# Patient Record
Sex: Male | Born: 1990 | Race: White | Hispanic: No | Marital: Single
Health system: Southern US, Community
[De-identification: ages and names within clinical notes are randomized; demographics above are authoritative.]

---

## 2012-04-07 ENCOUNTER — Emergency Department: Payer: Self-pay | Admitting: Emergency Medicine

## 2012-04-07 LAB — COMPREHENSIVE METABOLIC PANEL
Bilirubin,Total: 0.6 mg/dL (ref 0.2–1.0)
Chloride: 105 mmol/L (ref 98–107)
Co2: 27 mmol/L (ref 21–32)
Creatinine: 0.92 mg/dL (ref 0.60–1.30)
EGFR (Non-African Amer.): >60
Osmolality: 281 (ref 275–301)
Potassium: 3.7 mmol/L (ref 3.5–5.1)
SGOT(AST): 26 U/L (ref 10–41)
SGPT (ALT): 39 U/L (ref 12–78)

## 2012-04-07 LAB — CBC
HCT: 42.1 % (ref 40.0–52.0)
MCHC: 35.6 g/dL (ref 32.0–36.0)
MCV: 93 fL (ref 80–100)
Platelet: 311 10*3/uL (ref 150–440)
RBC: 4.55 10*6/uL (ref 4.40–5.90)
RDW: 12 % (ref 11.5–14.5)
WBC: 5.4 10*3/uL (ref 3.8–10.6)

## 2012-04-07 LAB — URINALYSIS, COMPLETE
Bilirubin,UR: NEGATIVE
Blood: NEGATIVE
Ketone: NEGATIVE
Nitrite: NEGATIVE
Ph: 5 (ref 4.5–8.0)
Protein: NEGATIVE
Specific Gravity: 1.026 (ref 1.003–1.030)
Squamous Epithelial: NONE SEEN
WBC UR: 1 /HPF (ref 0–5)

## 2012-04-07 LAB — DRUG SCREEN, URINE
Amphetamines, Ur Screen: POSITIVE (ref ?–1000)
Cannabinoid 50 Ng, Ur ~~LOC~~: NEGATIVE (ref ?–50)
MDMA (Ecstasy)Ur Screen: NEGATIVE (ref ?–500)
Methadone, Ur Screen: NEGATIVE (ref ?–300)
Opiate, Ur Screen: NEGATIVE (ref ?–300)
Phencyclidine (PCP) Ur S: NEGATIVE (ref ?–25)

## 2013-02-11 ENCOUNTER — Emergency Department: Payer: Self-pay | Admitting: Emergency Medicine

## 2013-02-22 ENCOUNTER — Emergency Department: Payer: Self-pay

## 2014-04-26 IMAGING — CT CT HEAD WITHOUT CONTRAST
2 series · 16 of 30 positions shown, 20 images · non-contrast
Comparison: none

REASON FOR EXAM: fell out of bed, ??seizure??, no history
COMMENTS:

PROCEDURE:     CT  - CT HEAD WITHOUT CONTRAST  - April 08, 2012 [DATE]
RESULT:     History: Fall.
Comparison Study: No recent.

[Series 2: without · axial · non-contrast · 0.44mm/px · z∈[-57,+73]mm · 13 of 36 slices shown, 17 images]
[im 3/36  brain]
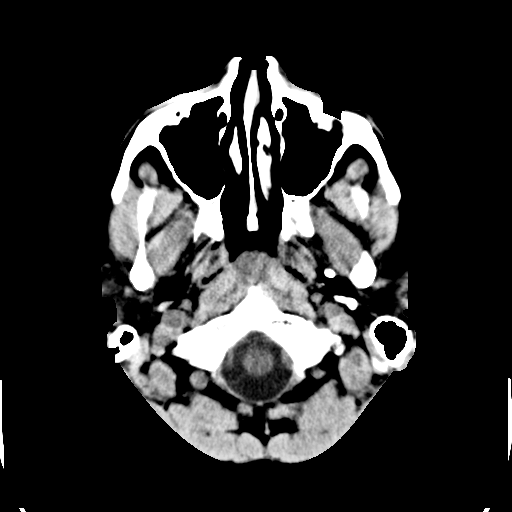
[im 3/36  bone]
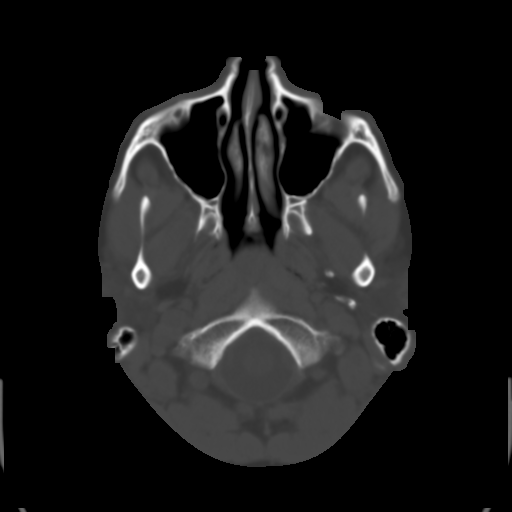
[im 6/36  brain]
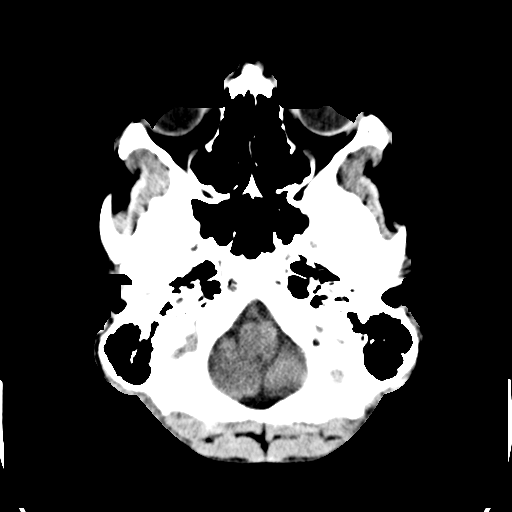
[im 8/36  brain]
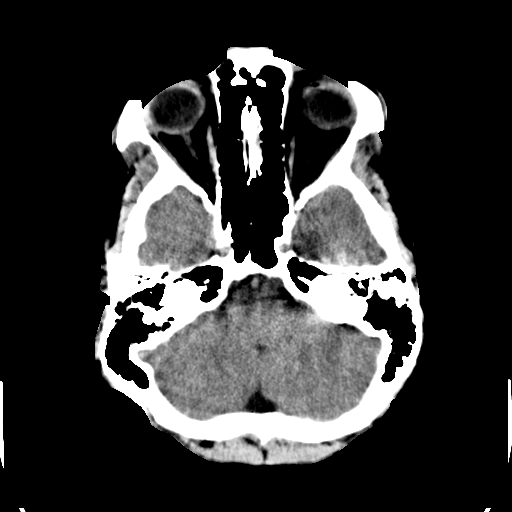
[im 11/36  brain]
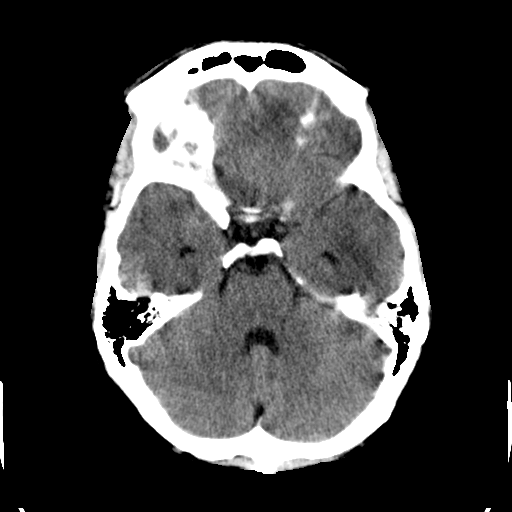
[im 13/36  brain]
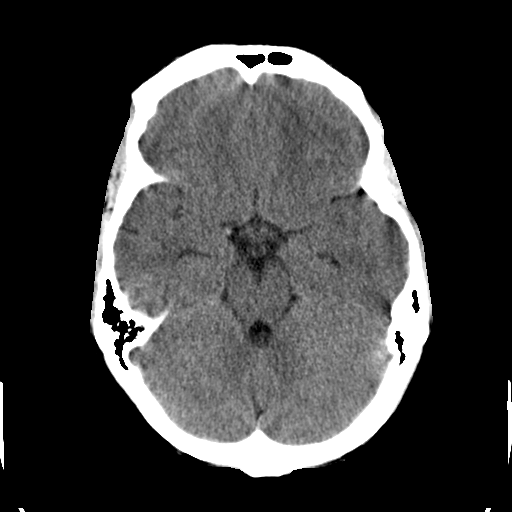
[im 13/36  bone]
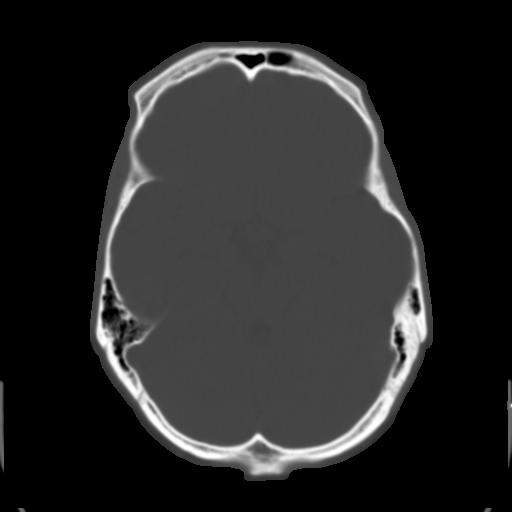
[im 16/36  brain]
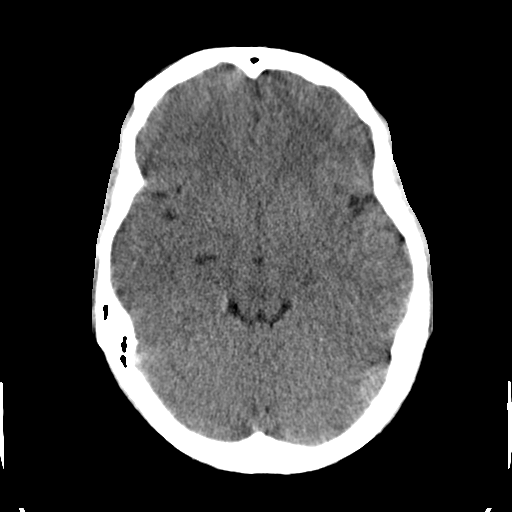
[im 18/36  brain]
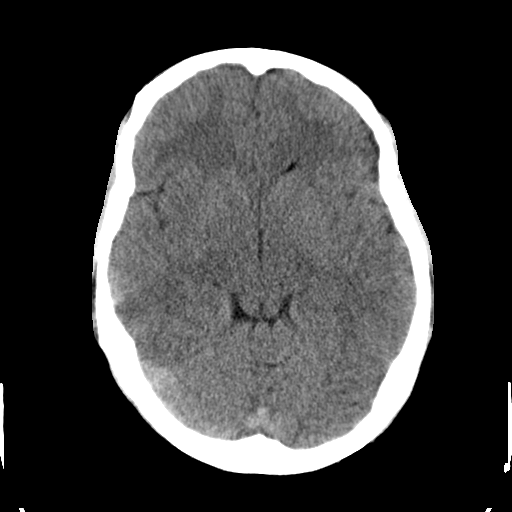
[im 21/36  brain]
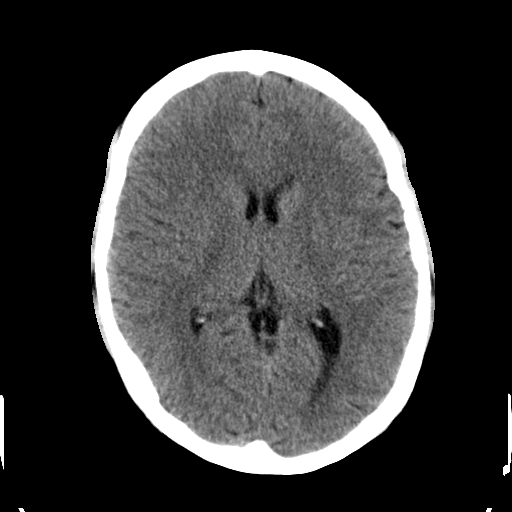
[im 23/36  brain]
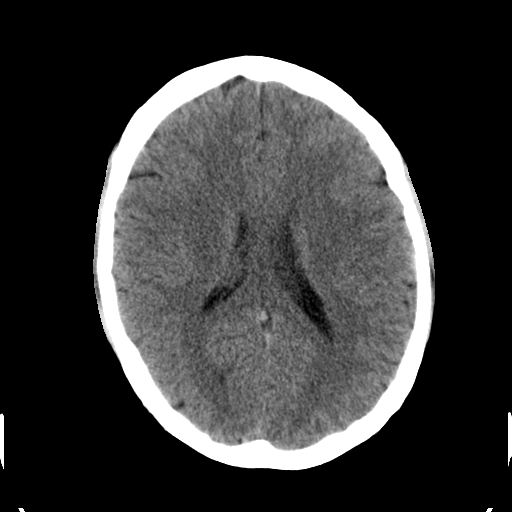
[im 23/36  bone]
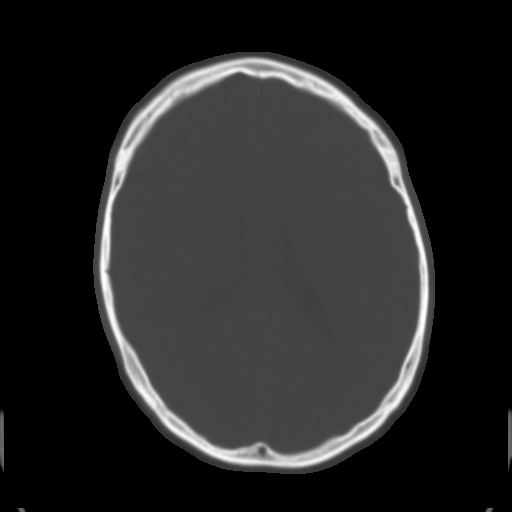
[im 26/36  brain]
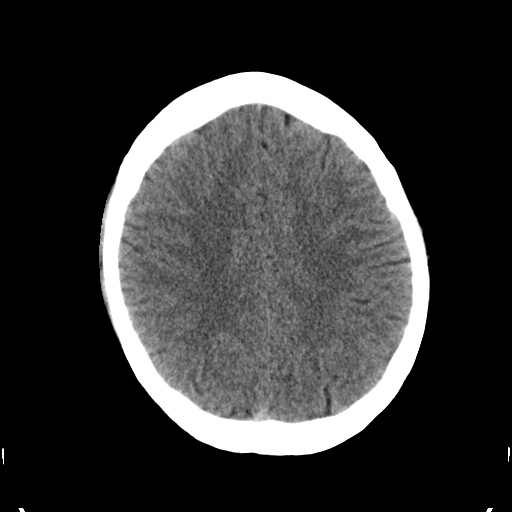
[im 28/36  brain]
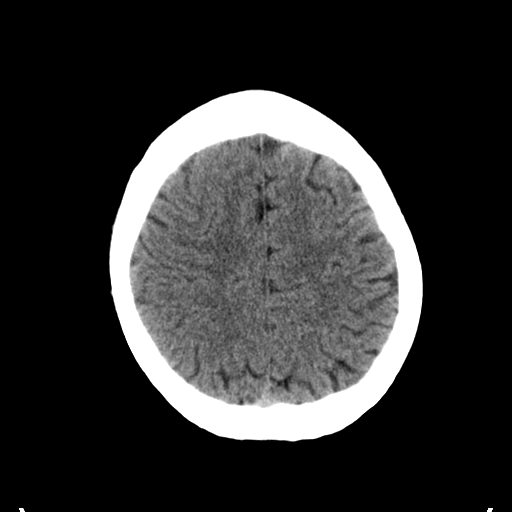
[im 31/36  brain]
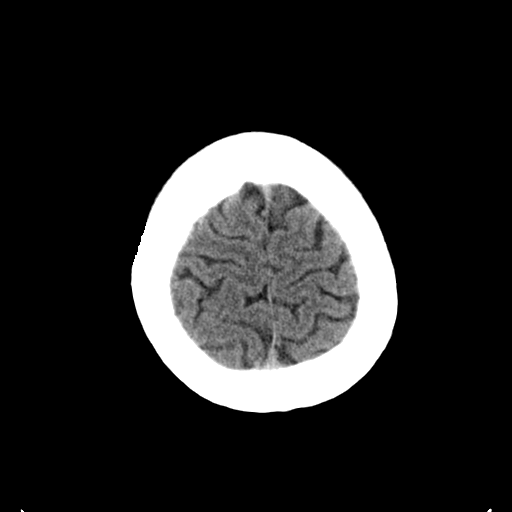
[im 33/36  brain]
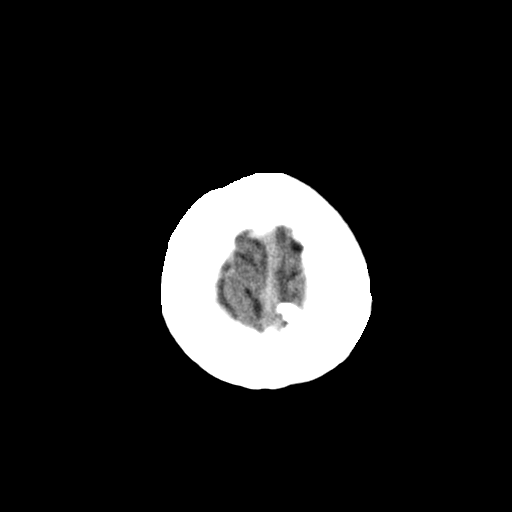
[im 33/36  bone]
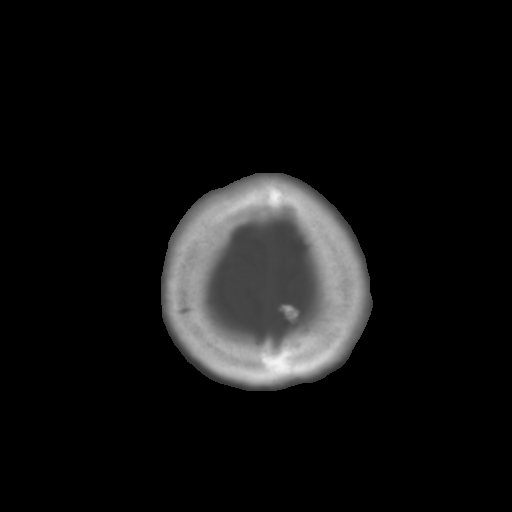

[Series 3: bone · axial · 0.44mm/px · z∈[-57,-14]mm · 3 of 36 slices shown]
[im 3/36  bone]
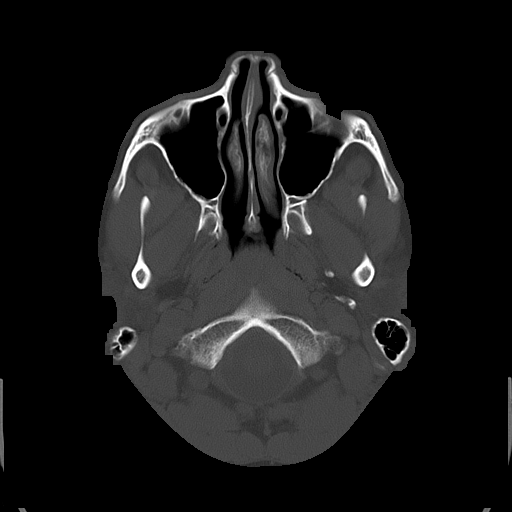
[im 8/36  bone]
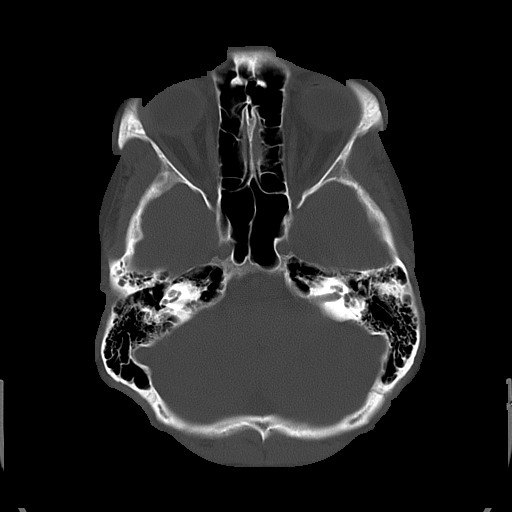
[im 13/36  bone]
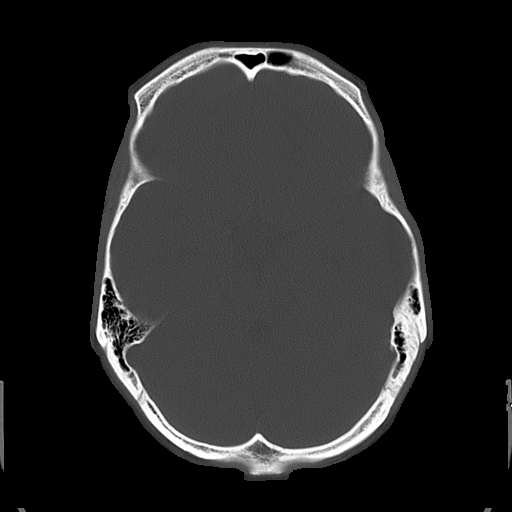

[16 of 30 positions shown; findings below may reference images not displayed]

FINDINGS: Standard nonenhanced CT obtained. No mass. No hydrocephalus. No
hemorrhage. No acute bony abnormality.
IMPRESSION: No acute abnormality.

## 2015-07-21 ENCOUNTER — Emergency Department
Admission: EM | Admit: 2015-07-21 | Discharge: 2015-07-21 | Disposition: A | Discharge status: Home / Self Care | Payer: 59 | Attending: Emergency Medicine | Admitting: Emergency Medicine

## 2015-07-21 ENCOUNTER — Emergency Department: Payer: 59

## 2015-07-21 DIAGNOSIS — S61411A Laceration without foreign body of right hand, initial encounter: Secondary | ICD-10-CM | POA: Insufficient documentation

## 2015-07-21 DIAGNOSIS — Y9389 Activity, other specified: Secondary | ICD-10-CM | POA: Insufficient documentation

## 2015-07-21 DIAGNOSIS — Z23 Encounter for immunization: Secondary | ICD-10-CM | POA: Diagnosis not present

## 2015-07-21 DIAGNOSIS — S61431A Puncture wound without foreign body of right hand, initial encounter: Secondary | ICD-10-CM | POA: Diagnosis not present

## 2015-07-21 DIAGNOSIS — W268XXA Contact with other sharp object(s), not elsewhere classified, initial encounter: Secondary | ICD-10-CM | POA: Diagnosis not present

## 2015-07-21 DIAGNOSIS — Y998 Other external cause status: Secondary | ICD-10-CM | POA: Insufficient documentation

## 2015-07-21 DIAGNOSIS — Y9289 Other specified places as the place of occurrence of the external cause: Secondary | ICD-10-CM | POA: Insufficient documentation

## 2015-07-21 DIAGNOSIS — S6991XA Unspecified injury of right wrist, hand and finger(s), initial encounter: Secondary | ICD-10-CM | POA: Diagnosis present

## 2015-07-21 MED ORDER — CEPHALEXIN 500 MG PO CAPS: 500.0000 mg | ORAL_CAPSULE | Freq: Four times a day (QID) | ORAL | Status: AC

## 2015-07-21 MED ORDER — TETANUS-DIPHTH-ACELL PERTUSSIS 5-2.5-18.5 LF-MCG/0.5 IM SUSP
0.5000 mL | Freq: Once | INTRAMUSCULAR | Status: AC
  Administered 2015-07-21: 0.5 mL via INTRAMUSCULAR
  Filled 2015-07-21: qty 0.5

## 2015-07-21 MED ORDER — CEPHALEXIN 500 MG PO CAPS
500.0000 mg | ORAL_CAPSULE | Freq: Once | ORAL | Status: AC
  Administered 2015-07-21: 500 mg via ORAL
  Filled 2015-07-21: qty 1

## 2015-07-21 NOTE — Discharge Instructions (Signed)
You have been seen today in the Emergency Department (ED) for a puncture wound of your hand. Please take your antibiotics as prescribed for their ENTIRE prescribed duration.  Take Tylenol or Motrin as needed for pain, but only as written on the box.   Please follow up with your doctor or orthopedics or in the ED in 24-48 hours for recheck of your infection if you are not improving.  Call your doctor sooner or return to the ED if you develop worsening signs of infection such as: increased redness, increased pain, pus, fever, or other symptoms that concern you.   Puncture Wound A puncture wound is an injury that is caused by a sharp, thin object that goes through (penetrates) your skin. Usually, a puncture wound does not leave a large opening in your skin, so it may not bleed a lot. However, when you get a puncture wound, dirt or other materials (foreign bodies) can be forced into your wound and break off inside. This increases the chance of infection, such as tetanus. CAUSES Puncture wounds are caused by any sharp, thin object that goes through your skin, such as:  Animal teeth, as with an animal bite.  Sharp, pointed objects, such as nails, splinters of glass, fishhooks, and needles. SYMPTOMS Symptoms of a puncture wound include:  Pain.  Bleeding.  Swelling.  Bruising.  Fluid leaking from the wound.  Numbness, tingling, or loss of function. DIAGNOSIS This condition is diagnosed with a medical history and physical exam. Your wound will be checked to see if it contains any foreign bodies. You may also have X-rays or other imaging tests. TREATMENT Treatment for a puncture wound depends on how serious the wound is. It also depends on whether the wound contains any foreign bodies. Treatment for all types of puncture wounds usually starts with:  Controlling the bleeding.  Washing out the wound with a germ-free (sterile) salt-water solution.  Checking the wound for foreign  bodies. Treatment may also include:  Having the wound opened surgically to remove a foreign object.  Closing the wound with stitches (sutures) if it continues to bleed.  Covering the wound with antibiotic ointments and a bandage (dressing).  Receiving a tetanus shot.  Receiving a rabies vaccine. HOME CARE INSTRUCTIONS Medicines  Take or apply over-the-counter and prescription medicines only as told by your health care provider.  If you were prescribed an antibiotic, take or apply it as told by your health care provider. Do not stop using the antibiotic even if your condition improves. Wound Care  There are many ways to close and cover a wound. For example, a wound can be covered with sutures, skin glue, or adhesive strips. Follow instructions from your health care provider about:  How to take care of your wound.  When and how you should change your dressing.  When you should remove your dressing.  Removing whatever was used to close your wound.  Keep the dressing dry as told by your health care provider. Do not take baths, swim, use a hot tub, or do anything that would put your wound underwater until your health care provider approves.  Clean the wound as told by your health care provider.  Do not scratch or pick at the wound.  Check your wound every day for signs of infection. Watch for:  Redness, swelling, or pain.  Fluid, blood, or pus. General Instructions  Raise (elevate) the injured area above the level of your heart while you are sitting or lying down.  If your puncture wound is in your foot, ask your health care provider if you need to avoid putting weight on your foot and for how long.  Keep all follow-up visits as told by your health care provider. This is important. SEEK MEDICAL CARE IF:  You received a tetanus shot and you have swelling, severe pain, redness, or bleeding at the injection site.  You have a fever.  Your sutures come out.  You notice  a bad smell coming from your wound or your dressing.  You notice something coming out of your wound, such as wood or glass.  Your pain is not controlled with medicine.  You have increased redness, swelling, or pain at the site of your wound.  You have fluid, blood, or pus coming from your wound.  You notice a change in the color of your skin near your wound.  You need to change the dressing frequently due to fluid, blood, or pus draining from your wound.  You develop a new rash.  You develop numbness around your wound. SEEK IMMEDIATE MEDICAL CARE IF:  You develop severe swelling around your wound.  Your pain suddenly increases and is severe.  You develop painful skin lumps.  You have a red streak going away from your wound.  The wound is on your hand or foot and you cannot properly move a finger or toe.  The wound is on your hand or foot and you notice that your fingers or toes look pale or bluish.   This information is not intended to replace advice given to you by your health care provider. Make sure you discuss any questions you have with your health care provider.   Document Released: 03/04/2005 Document Revised: 02/13/2015 Document Reviewed: 07/18/2014 Elsevier Interactive Patient Education [employer]

## 2015-07-21 NOTE — ED Notes (Signed)
Iron fence, not wood

## 2015-07-21 NOTE — ED Provider Notes (Signed)
[facility] Emergency Department Provider Note  ____________________________________________  Time seen: Approximately 7:02 PM  I have reviewed the triage vital signs and the nursing notes.   HISTORY  Chief Complaint Extremity Laceration    HPI Mike Flores is a 25 y.o. male presents after sustaining a laceration to the webspace between his thumb and index finger. This happened just prior to arrival and he was punctured by a piece of fencing. He does not believe it went very deep but left a "pinhole" cut in his hand.  It is no longer bleeding. He denies it is painful beyond his family little tender. No numbness tingling or weakness in the hand thumb or index finger. He is unsure but thinks his last tetanus was approximately 5 years ago.  No other injury. Got caught on the top of a piece of fencing which he was installing.   No past medical history on file.  There are no active problems to display for this patient.   No past surgical history on file.  Current Outpatient Rx  Name  Route  Sig  Dispense  Refill  . cephALEXin (KEFLEX) 500 MG capsule   Oral   Take 1 capsule (500 mg total) by mouth 4 (four) times daily.   40 capsule   0     Allergies Review of patient's allergies indicates not on file. No known drug allergies No family history on file.  Social History Social History  Substance Use Topics  . Smoking status: Not on file  . Smokeless tobacco: Not on file  . Alcohol Use: Not on file   does not use any drugs, no tobacco  Review of Systems Constitutional: No fever/chills No other injury. Cardiovascular: Denies chest pain. Respiratory: Denies shortness of breath. Gastrointestinal: No abdominal pain.  No nausea, no vomiting.   Musculoskeletal: Negative for back pain. Skin: Negative for rash. Neurological: Negative for headaches, focal weakness or numbness.  10-point ROS otherwise  negative.  ____________________________________________   PHYSICAL EXAM:  VITAL SIGNS: ED Triage Vitals  Enc Vitals Group     BP 07/21/15 1737 127/62 mmHg     Pulse Rate 07/21/15 1737 91     Resp 07/21/15 1737 16     Temp 07/21/15 1737 98.5 F (36.9 C)     Temp src --      SpO2 07/21/15 1737 98 %     Weight 07/21/15 1737 224 lb (101.606 kg)     Height 07/21/15 1737  (1.956 m)     Head Cir --      Peak Flow --      Pain Score 07/21/15 1738 0     Pain Loc --      Pain Edu? --      Excl. in GC? --    Constitutional: Alert and oriented. Well appearing and in no acute distress. Eyes: Conjunctivae are normal.  Head: Atraumatic. Nose: No congestion/rhinnorhea. Mouth/Throat: Mucous membranes are moist.   Neck: No stridor.   Cardiovascular: Normal rate, regular rhythm. Strong bilateral radial pulses. Normal capillary refill of all digits right hand. Respiratory: Normal respiratory effort.  No retractions. Speaks clear fluent sentences. Musculoskeletal:  Right hand Median, ulnar, radial motor intact. Cap refill less than 2 seconds all digits. Strong radial pulse. 5 out of 5 strength throughout the hand intrinsics, flexion and extension at the wrist. No evidence of trauma except for a single small approximately half centimeter punctate puncture wound over the web interspace between the first  and second digits, no bleeding or evidence of foreign body. Irrigated copiously with tap water and cleansed. Small area of punctate injury. Decision was made to leave open and allow it to heal over exposed to suturing over. The laceration and puncture itself just less than half a centimeter with no bleeding.  Left hand Atraumatic. Normal use. ____________________________________________   Neurologic:  Normal speech and language. No gross focal neurologic deficits are appreciated. No gait instability. Skin:  Skin is warm, dry and intact. No rash noted. Psychiatric: Mood and affect are  normal. Speech and behavior are normal.  ____________________________________________   LABS (all labs ordered are listed, but only abnormal results are displayed)  Labs Reviewed - No data to display ____________________________________________  EKG   ____________________________________________  RADIOLOGY  DG Finger Thumb Right (Final result) Result time: 07/21/15 20:15:20   Final result by Rad Results In Interface (07/21/15 20:15:20)   Narrative:   CLINICAL DATA: Puncture wound between the thumb and index finger from a metal fence today. Initial encounter.  EXAM: RIGHT THUMB 2+V  COMPARISON: None.  FINDINGS: No acute fracture or dislocation is identified. There is mild soft tissue irregularity in the webspace between the first and second digits compatible with provided history, possibly with minimal subcutaneous emphysema. No radiopaque foreign body is seen. Bone mineralization appears normal.  IMPRESSION: No acute osseous abnormality or radiopaque foreign body identified.   Electronically Signed By: Sebastian Ache M.D. On: 07/21/2015 20:15       ____________________________________________   PROCEDURES  Procedure(s) performed: None  Critical Care performed: No  ____________________________________________   INITIAL IMPRESSION / ASSESSMENT AND PLAN / ED COURSE  Pertinent labs & imaging results that were available during my care of the patient were reviewed by me and considered in my medical decision making (see chart for details).  Puncture wound to the right hand. No evidence of foreign body or neurovascular compromise or superinfection. Tetanus given, cephalexin given, and decision to leave open and allow to heal to prevent deep tissue infection. Very careful return precautions and treatment recommendations clearly given to the patient is agreeable with the plan. ____________________________________________   FINAL CLINICAL IMPRESSION(S)  / ED DIAGNOSES  Final diagnoses:  Puncture wound of right hand, initial encounter      Sharyn Creamer, MD 07/21/15 2313

## 2015-07-21 NOTE — ED Notes (Signed)
Laceration from fence to rt hand - tetanus utd

## 2017-08-07 IMAGING — CR DG FINGER THUMB 2+V*R*
1 series · 3 of 3 positions shown · non-contrast
Comparison: None.

CLINICAL DATA: Puncture wound between the thumb and index finger
from a metal fence today. Initial encounter.

EXAM:
RIGHT THUMB 2+V

[Series 1: pa · 0.17mm/px · 3 of 3 slices shown]
[im 1/3]
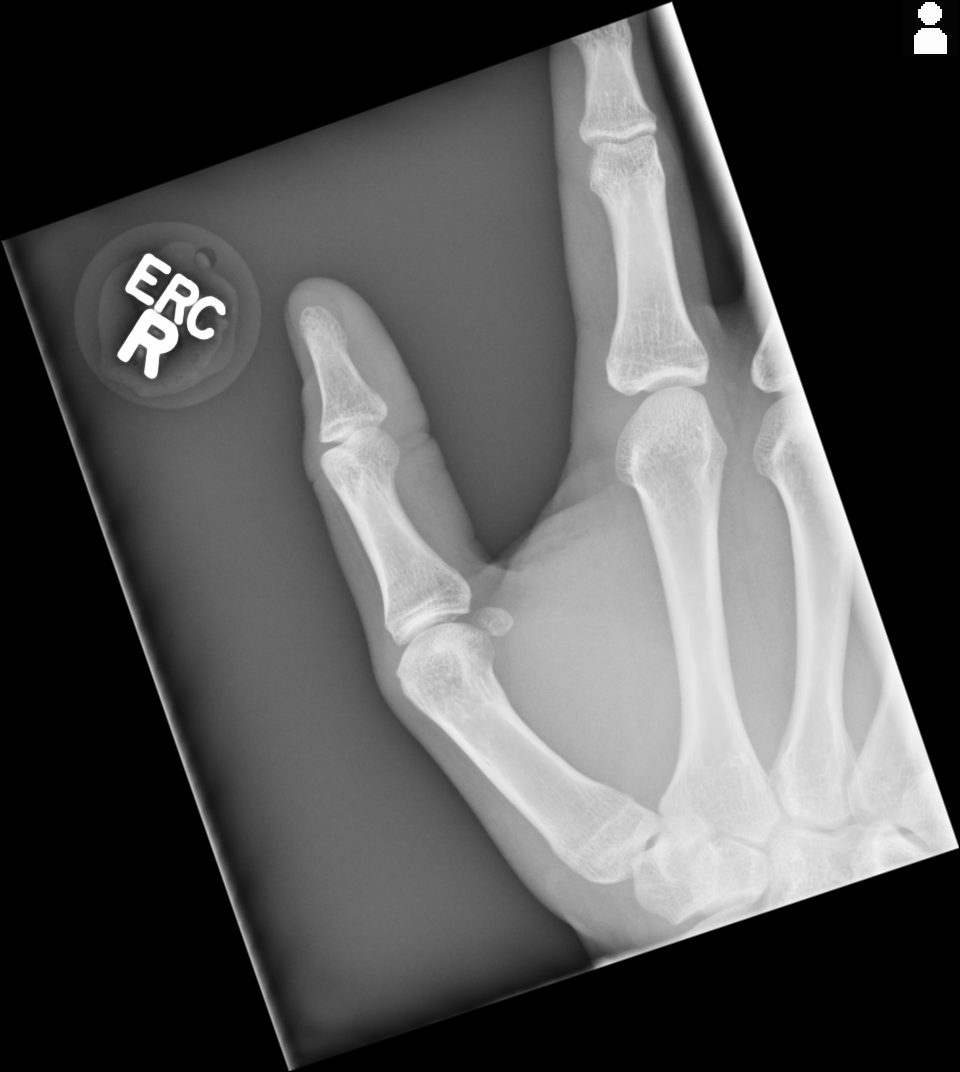
[im 2/3]
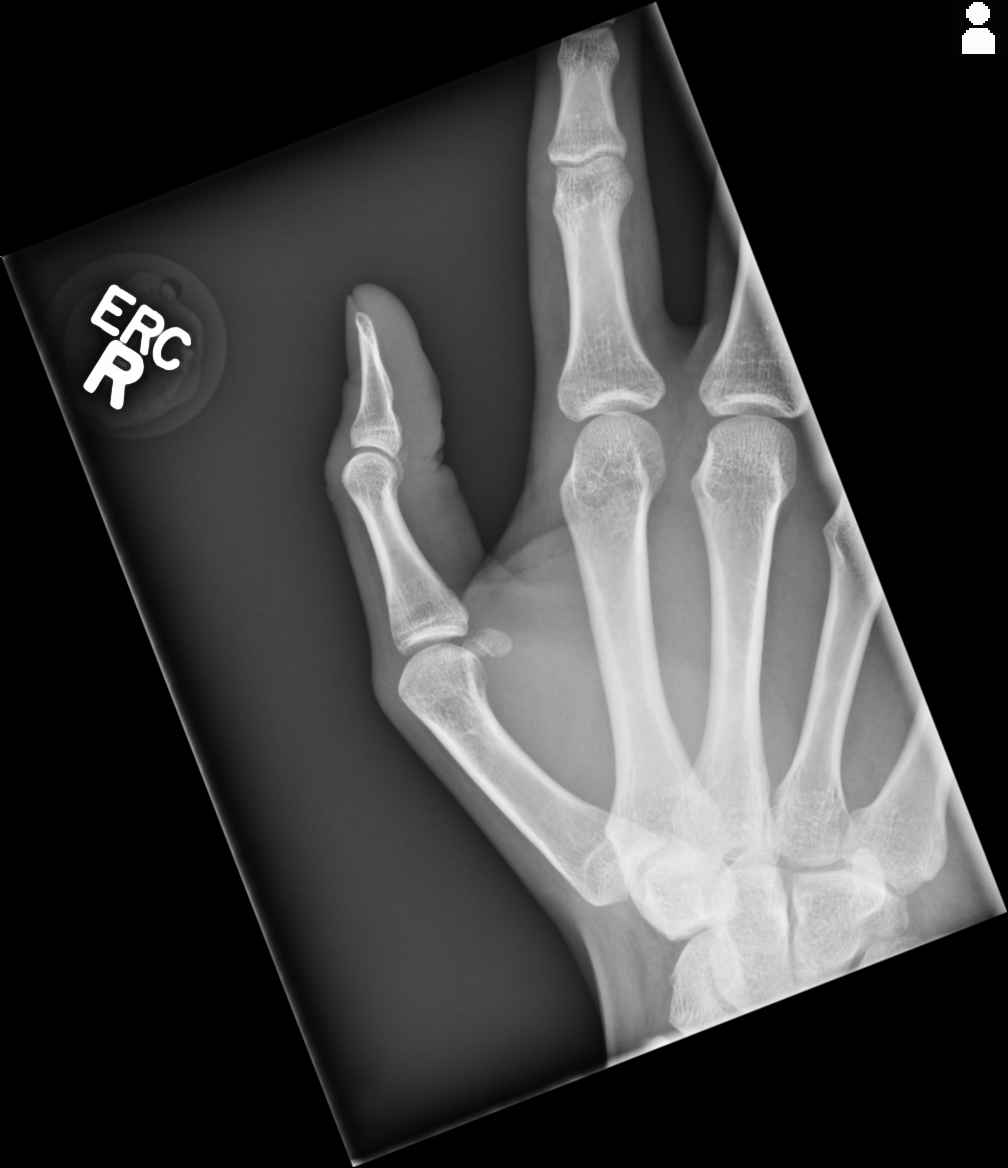
[im 3/3]
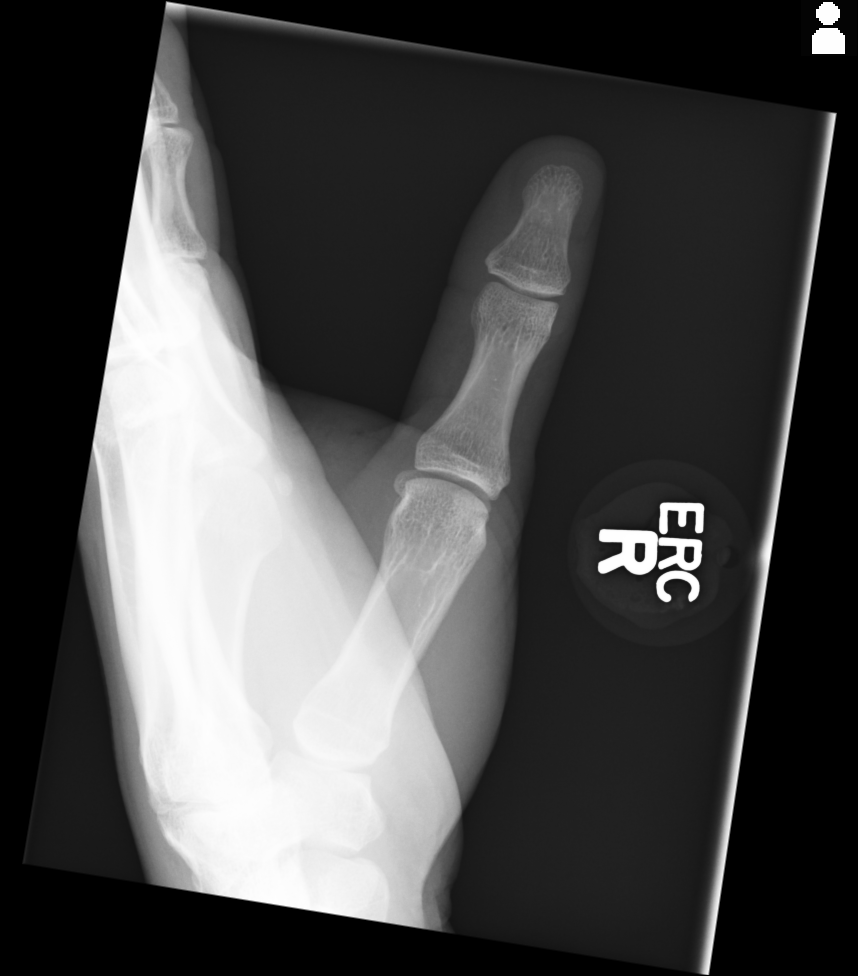

[3 of 3 positions shown; findings below may reference images not displayed]

FINDINGS: No acute fracture or dislocation is identified. There is mild soft
tissue irregularity in the webspace between the first and second
digits compatible with provided history, possibly with minimal
subcutaneous emphysema. No radiopaque foreign body is seen. Bone
mineralization appears normal.
IMPRESSION: No acute osseous abnormality or radiopaque foreign body identified.
